# Patient Record
Sex: Male | Born: 1955 | Race: White | Hispanic: No | Marital: Married | Smoking: Former smoker
Health system: Southern US, Community
[De-identification: ages and names within clinical notes are randomized; demographics above are authoritative.]

## PROBLEM LIST (undated history)

## (undated) DIAGNOSIS — Z8719 Personal history of other diseases of the digestive system: Secondary | ICD-10-CM

## (undated) DIAGNOSIS — M1990 Unspecified osteoarthritis, unspecified site: Secondary | ICD-10-CM

## (undated) DIAGNOSIS — I10 Essential (primary) hypertension: Secondary | ICD-10-CM

## (undated) HISTORY — PX: SHOULDER SURGERY: SHX246

## (undated) HISTORY — PX: TONSILLECTOMY: SUR1361

## (undated) HISTORY — PX: OTHER SURGICAL HISTORY: SHX169

---

## 1988-04-08 HISTORY — PX: BACK SURGERY: SHX140

## 1998-11-08 ENCOUNTER — Ambulatory Visit (HOSPITAL_COMMUNITY): Admission: RE | Admit: 1998-11-08 | Discharge: 1998-11-08 | Payer: Self-pay | Admitting: Sports Medicine

## 1998-11-08 ENCOUNTER — Encounter: Payer: Self-pay | Admitting: Sports Medicine

## 2013-05-28 ENCOUNTER — Ambulatory Visit: Payer: Worker's Compensation

## 2013-05-28 ENCOUNTER — Other Ambulatory Visit: Payer: Self-pay | Admitting: Occupational Medicine

## 2013-05-28 DIAGNOSIS — M25512 Pain in left shoulder: Secondary | ICD-10-CM

## 2013-08-04 ENCOUNTER — Encounter (HOSPITAL_COMMUNITY): Payer: Self-pay | Admitting: *Deleted

## 2013-08-04 MED ORDER — CEFAZOLIN SODIUM-DEXTROSE 2-3 GM-% IV SOLR
2.0000 g | INTRAVENOUS | Status: AC
  Administered 2013-08-05: 2 g via INTRAVENOUS
  Filled 2013-08-04: qty 50

## 2013-08-04 MED ORDER — CHLORHEXIDINE GLUCONATE 4 % EX LIQD
60.0000 mL | Freq: Once | CUTANEOUS | Status: DC
  Filled 2013-08-04: qty 60

## 2013-08-04 NOTE — Progress Notes (Signed)
Pt's PCP is Dr. Bobette MoHelen Blackburn in [place], [place].  He states he has not had an EKG or CXR in past year. Denies chest pain or sob.

## 2013-08-04 NOTE — H&P (Signed)
  [name] BordersMichael L [name] is an 58 y.o. male.    Chief Complaint: left shoulder pain and weakness  HPI: Pt is a 58 y.o. male complaining of left shoulder pain for several months. Pain had continually increased since the beginning. X-rays in the clinic show large rotator cuff tear left shoulder. Pt has tried various conservative treatments which have failed to alleviate their symptoms, including injections and therapy. Various options are discussed with the patient. Risks, benefits and expectations were discussed with the patient. Patient understand the risks, benefits and expectations and wishes to proceed with surgery.   PCP:  No primary provider on file.  D/C Plans:  Home  PMH: Past Medical History  Diagnosis Date  . Hypertension   . H/O hiatal hernia   . Arthritis     PSH: Past Surgical History  Procedure Laterality Date  . Back surgery  1990    lumbar fusion  . Shoulder surgery      right  . Arthroscopic knee surgery Left   . Tonsillectomy      Social History:  reports that he quit smoking about 30 years ago. He has never used smokeless tobacco. He reports that he does not drink alcohol or use illicit drugs.  Allergies:  No Known Allergies  Medications: No current facility-administered medications for this encounter.   No current outpatient prescriptions on file.    No results found for this or any previous visit (from the past 48 hour(s)). No results found.  ROS: Pain with rom of the left upper extremity  Physical Exam:  Alert and oriented 58 y.o. male in no acute distress Cranial nerves 2-12 intact Cervical spine: full rom with no tenderness, nv intact distally Chest: active breath sounds bilaterally, no wheeze rhonchi or rales Heart: regular rate and rhythm, no murmur Abd: non tender non distended with active bowel sounds Hip is stable with rom  Left upper extremity with mild limitation with rom nv intact distally No rashes or  edema  Assessment/Plan Assessment: left shoulder large rotator cuff tear  Plan: Patient will undergo a left shoulder scope with rotator cuff repair by [facility]. Risks benefits and expectations were discussed with the patient. Patient understand risks, benefits and expectations and wishes to proceed.

## 2013-08-05 ENCOUNTER — Encounter (HOSPITAL_COMMUNITY): Payer: Self-pay | Admitting: *Deleted

## 2013-08-05 ENCOUNTER — Ambulatory Visit (HOSPITAL_COMMUNITY): Payer: Worker's Compensation | Admitting: Vascular Surgery

## 2013-08-05 ENCOUNTER — Ambulatory Visit (HOSPITAL_COMMUNITY): Payer: Worker's Compensation

## 2013-08-05 ENCOUNTER — Encounter (HOSPITAL_COMMUNITY)
Admission: RE | Disposition: A | Discharge status: Home / Self Care | Payer: Self-pay | Source: Ambulatory Visit | Attending: Orthopedic Surgery

## 2013-08-05 ENCOUNTER — Observation Stay (HOSPITAL_COMMUNITY)
Admission: RE | Admit: 2013-08-05 | Discharge: 2013-08-06 | Disposition: A | Discharge status: Home / Self Care | Payer: Worker's Compensation | Source: Ambulatory Visit | Attending: Orthopedic Surgery | Admitting: Orthopedic Surgery

## 2013-08-05 ENCOUNTER — Encounter (HOSPITAL_COMMUNITY): Payer: Worker's Compensation | Admitting: Vascular Surgery

## 2013-08-05 ENCOUNTER — Encounter (HOSPITAL_COMMUNITY): Payer: Worker's Compensation | Admitting: Pharmacy Technician

## 2013-08-05 DIAGNOSIS — S43429A Sprain of unspecified rotator cuff capsule, initial encounter: Principal | ICD-10-CM | POA: Diagnosis present

## 2013-08-05 DIAGNOSIS — Z5333 Arthroscopic surgical procedure converted to open procedure: Secondary | ICD-10-CM | POA: Insufficient documentation

## 2013-08-05 DIAGNOSIS — I10 Essential (primary) hypertension: Secondary | ICD-10-CM | POA: Insufficient documentation

## 2013-08-05 DIAGNOSIS — Z981 Arthrodesis status: Secondary | ICD-10-CM | POA: Insufficient documentation

## 2013-08-05 DIAGNOSIS — Z87891 Personal history of nicotine dependence: Secondary | ICD-10-CM | POA: Insufficient documentation

## 2013-08-05 DIAGNOSIS — K449 Diaphragmatic hernia without obstruction or gangrene: Secondary | ICD-10-CM | POA: Insufficient documentation

## 2013-08-05 DIAGNOSIS — Y9289 Other specified places as the place of occurrence of the external cause: Secondary | ICD-10-CM | POA: Insufficient documentation

## 2013-08-05 DIAGNOSIS — W19XXXA Unspecified fall, initial encounter: Secondary | ICD-10-CM | POA: Insufficient documentation

## 2013-08-05 DIAGNOSIS — Y990 Civilian activity done for income or pay: Secondary | ICD-10-CM | POA: Insufficient documentation

## 2013-08-05 DIAGNOSIS — S43439A Superior glenoid labrum lesion of unspecified shoulder, initial encounter: Secondary | ICD-10-CM | POA: Insufficient documentation

## 2013-08-05 HISTORY — PX: SHOULDER ARTHROSCOPY WITH SUBACROMIAL DECOMPRESSION AND OPEN ROTATOR C: SHX5688

## 2013-08-05 HISTORY — DX: Personal history of other diseases of the digestive system: Z87.19

## 2013-08-05 HISTORY — DX: Unspecified osteoarthritis, unspecified site: M19.90

## 2013-08-05 HISTORY — DX: Essential (primary) hypertension: I10

## 2013-08-05 LAB — BASIC METABOLIC PANEL
BUN: 21 mg/dL (ref 6–23)
CO2: 24 mEq/L (ref 19–32)
Calcium: 9.8 mg/dL (ref 8.4–10.5)
Chloride: 98 mEq/L (ref 96–112)
Creatinine, Ser: 0.82 mg/dL (ref 0.50–1.35)
Glucose, Bld: 82 mg/dL (ref 70–99)
POTASSIUM: 4 meq/L (ref 3.7–5.3)
Sodium: 137 mEq/L (ref 137–147)

## 2013-08-05 LAB — CBC
HCT: 44.7 % (ref 39.0–52.0)
Hemoglobin: 15.2 g/dL (ref 13.0–17.0)
MCH: 29.3 pg (ref 26.0–34.0)
MCHC: 34 g/dL (ref 30.0–36.0)
MCV: 86.3 fL (ref 78.0–100.0)
Platelets: 208 10*3/uL (ref 150–400)
RBC: 5.18 MIL/uL (ref 4.22–5.81)
RDW: 13.3 % (ref 11.5–15.5)
WBC: 8.1 10*3/uL (ref 4.0–10.5)

## 2013-08-05 SURGERY — SHOULDER ARTHROSCOPY WITH SUBACROMIAL DECOMPRESSION AND OPEN ROTATOR CUFF REPAIR, OPEN BICEPS TENDON REPAIR
Anesthesia: General | Site: Shoulder | Laterality: Left

## 2013-08-05 MED ORDER — OXYCODONE HCL 5 MG PO TABS
5.0000 mg | ORAL_TABLET | ORAL | Status: DC
  Administered 2013-08-05 – 2013-08-06 (×5): 10 mg via ORAL
  Filled 2013-08-05 (×5): qty 2

## 2013-08-05 MED ORDER — PROMETHAZINE HCL 25 MG/ML IJ SOLN
6.2500 mg | INTRAMUSCULAR | Status: AC | PRN
  Administered 2013-08-05 (×2): 6.25 mg via INTRAVENOUS

## 2013-08-05 MED ORDER — ONDANSETRON HCL 4 MG PO TABS: 4.0000 mg | ORAL_TABLET | Freq: Four times a day (QID) | ORAL | Status: DC | PRN

## 2013-08-05 MED ORDER — MIDAZOLAM HCL 2 MG/2ML IJ SOLN
INTRAMUSCULAR | Status: AC
  Filled 2013-08-05: qty 2

## 2013-08-05 MED ORDER — NEOSTIGMINE METHYLSULFATE 10 MG/10ML IV SOLN
INTRAVENOUS | Status: AC
  Filled 2013-08-05: qty 1

## 2013-08-05 MED ORDER — METOCLOPRAMIDE HCL 10 MG PO TABS: 5.0000 mg | ORAL_TABLET | Freq: Three times a day (TID) | ORAL | Status: DC | PRN

## 2013-08-05 MED ORDER — SUCCINYLCHOLINE CHLORIDE 20 MG/ML IJ SOLN
INTRAMUSCULAR | Status: DC | PRN
  Administered 2013-08-05: 100 mg via INTRAVENOUS

## 2013-08-05 MED ORDER — LIDOCAINE HCL (CARDIAC) 20 MG/ML IV SOLN
INTRAVENOUS | Status: AC
  Filled 2013-08-05: qty 5

## 2013-08-05 MED ORDER — METHOCARBAMOL 500 MG PO TABS: 500.0000 mg | ORAL_TABLET | Freq: Four times a day (QID) | ORAL | Status: DC | PRN

## 2013-08-05 MED ORDER — 0.9 % SODIUM CHLORIDE (POUR BTL) OPTIME
TOPICAL | Status: DC | PRN
  Administered 2013-08-05: 1000 mL

## 2013-08-05 MED ORDER — BUPIVACAINE-EPINEPHRINE (PF) 0.25% -1:200000 IJ SOLN
INTRAMUSCULAR | Status: AC
  Filled 2013-08-05: qty 30

## 2013-08-05 MED ORDER — LACTATED RINGERS IV SOLN
INTRAVENOUS | Status: DC
  Administered 2013-08-05 (×4): via INTRAVENOUS

## 2013-08-05 MED ORDER — MIDAZOLAM HCL 5 MG/5ML IJ SOLN
INTRAMUSCULAR | Status: DC | PRN
  Administered 2013-08-05: 2 mg via INTRAVENOUS

## 2013-08-05 MED ORDER — SODIUM CHLORIDE 0.9 % IV SOLN
10.0000 mg | INTRAVENOUS | Status: DC | PRN
  Administered 2013-08-05: 10 ug/min via INTRAVENOUS

## 2013-08-05 MED ORDER — METHOCARBAMOL 1000 MG/10ML IJ SOLN
500.0000 mg | Freq: Four times a day (QID) | INTRAVENOUS | Status: DC
  Administered 2013-08-05: 500 mg via INTRAVENOUS
  Filled 2013-08-05 (×5): qty 5

## 2013-08-05 MED ORDER — BUPIVACAINE-EPINEPHRINE 0.25% -1:200000 IJ SOLN
INTRAMUSCULAR | Status: DC | PRN
  Administered 2013-08-05: 10 mL

## 2013-08-05 MED ORDER — FERROUS SULFATE 325 (65 FE) MG PO TABS
325.0000 mg | ORAL_TABLET | Freq: Every day | ORAL | Status: DC
  Administered 2013-08-06: 325 mg via ORAL
  Filled 2013-08-05 (×2): qty 1

## 2013-08-05 MED ORDER — PROPOFOL 10 MG/ML IV BOLUS
INTRAVENOUS | Status: DC | PRN
  Administered 2013-08-05: 200 mg via INTRAVENOUS

## 2013-08-05 MED ORDER — MELOXICAM 15 MG PO TABS
15.0000 mg | ORAL_TABLET | Freq: Every day | ORAL | Status: DC
  Administered 2013-08-05: 15 mg via ORAL
  Filled 2013-08-05 (×2): qty 1

## 2013-08-05 MED ORDER — HYDROCHLOROTHIAZIDE 25 MG PO TABS
25.0000 mg | ORAL_TABLET | Freq: Every day | ORAL | Status: DC
  Administered 2013-08-05: 25 mg via ORAL
  Filled 2013-08-05 (×2): qty 1

## 2013-08-05 MED ORDER — TRAMADOL HCL 50 MG PO TABS: 50.0000 mg | ORAL_TABLET | Freq: Every day | ORAL | Status: DC | PRN

## 2013-08-05 MED ORDER — OMEGA-3-ACID ETHYL ESTERS 1 G PO CAPS
1.0000 g | ORAL_CAPSULE | Freq: Every day | ORAL | Status: DC
  Administered 2013-08-05: 1 g via ORAL
  Filled 2013-08-05 (×2): qty 1

## 2013-08-05 MED ORDER — ENOXAPARIN SODIUM 40 MG/0.4ML ~~LOC~~ SOLN
40.0000 mg | SUBCUTANEOUS | Status: DC
  Administered 2013-08-06: 40 mg via SUBCUTANEOUS
  Filled 2013-08-05 (×2): qty 0.4

## 2013-08-05 MED ORDER — DEXTROSE 5 % IV SOLN: 500.0000 mg | Freq: Four times a day (QID) | INTRAVENOUS | Status: DC | PRN

## 2013-08-05 MED ORDER — ENOXAPARIN SODIUM 40 MG/0.4ML ~~LOC~~ SOLN: 40.0000 mg | SUBCUTANEOUS | Status: DC

## 2013-08-05 MED ORDER — METOCLOPRAMIDE HCL 5 MG/ML IJ SOLN
5.0000 mg | Freq: Three times a day (TID) | INTRAMUSCULAR | Status: DC | PRN
  Administered 2013-08-05: 10 mg via INTRAVENOUS
  Filled 2013-08-05: qty 2

## 2013-08-05 MED ORDER — PROMETHAZINE HCL 50 MG PO TABS: 25.0000 mg | ORAL_TABLET | Freq: Four times a day (QID) | ORAL | Status: AC | PRN

## 2013-08-05 MED ORDER — METHOCARBAMOL 500 MG PO TABS: 500.0000 mg | ORAL_TABLET | Freq: Three times a day (TID) | ORAL | Status: AC | PRN

## 2013-08-05 MED ORDER — OXYCODONE HCL 5 MG PO TABS: 5.0000 mg | ORAL_TABLET | Freq: Once | ORAL | Status: DC | PRN

## 2013-08-05 MED ORDER — ONDANSETRON HCL 4 MG/2ML IJ SOLN
INTRAMUSCULAR | Status: AC
  Filled 2013-08-05: qty 2

## 2013-08-05 MED ORDER — OXYCODONE HCL 5 MG/5ML PO SOLN
5.0000 mg | Freq: Once | ORAL | Status: DC | PRN
Start: 2013-08-05 — End: 2013-08-05

## 2013-08-05 MED ORDER — PROMETHAZINE HCL 25 MG/ML IJ SOLN
INTRAMUSCULAR | Status: AC
  Filled 2013-08-05: qty 1

## 2013-08-05 MED ORDER — FENTANYL CITRATE 0.05 MG/ML IJ SOLN
INTRAMUSCULAR | Status: AC
  Filled 2013-08-05: qty 2

## 2013-08-05 MED ORDER — KETOROLAC TROMETHAMINE 30 MG/ML IJ SOLN
INTRAMUSCULAR | Status: DC | PRN
  Administered 2013-08-05: 30 mg via INTRAVENOUS

## 2013-08-05 MED ORDER — MENTHOL 3 MG MT LOZG: 1.0000 | LOZENGE | OROMUCOSAL | Status: DC | PRN

## 2013-08-05 MED ORDER — PROPOFOL 10 MG/ML IV BOLUS
INTRAVENOUS | Status: AC
  Filled 2013-08-05: qty 20

## 2013-08-05 MED ORDER — PANTOPRAZOLE SODIUM 40 MG PO TBEC
80.0000 mg | DELAYED_RELEASE_TABLET | Freq: Every day | ORAL | Status: DC
  Administered 2013-08-05: 80 mg via ORAL
  Filled 2013-08-05: qty 2

## 2013-08-05 MED ORDER — CEFAZOLIN SODIUM-DEXTROSE 2-3 GM-% IV SOLR
2.0000 g | Freq: Four times a day (QID) | INTRAVENOUS | Status: DC
  Administered 2013-08-05 – 2013-08-06 (×2): 2 g via INTRAVENOUS
  Filled 2013-08-05 (×3): qty 50

## 2013-08-05 MED ORDER — GLYCOPYRROLATE 0.2 MG/ML IJ SOLN
INTRAMUSCULAR | Status: AC
  Filled 2013-08-05: qty 3

## 2013-08-05 MED ORDER — SODIUM CHLORIDE 0.9 % IR SOLN
Status: DC | PRN
  Administered 2013-08-05: 6000 mL

## 2013-08-05 MED ORDER — ACETAMINOPHEN 650 MG RE SUPP: 650.0000 mg | Freq: Four times a day (QID) | RECTAL | Status: DC | PRN

## 2013-08-05 MED ORDER — METHOCARBAMOL 500 MG PO TABS
500.0000 mg | ORAL_TABLET | Freq: Four times a day (QID) | ORAL | Status: DC
  Administered 2013-08-06: 500 mg via ORAL
  Filled 2013-08-05 (×5): qty 1

## 2013-08-05 MED ORDER — PROMETHAZINE HCL 25 MG/ML IJ SOLN: 25.0000 mg | INTRAMUSCULAR | Status: DC | PRN

## 2013-08-05 MED ORDER — KETOROLAC TROMETHAMINE 30 MG/ML IJ SOLN
INTRAMUSCULAR | Status: AC
  Filled 2013-08-05: qty 1

## 2013-08-05 MED ORDER — BUPIVACAINE-EPINEPHRINE (PF) 0.5% -1:200000 IJ SOLN
INTRAMUSCULAR | Status: DC | PRN
  Administered 2013-08-05: 150 mL

## 2013-08-05 MED ORDER — ROCURONIUM BROMIDE 50 MG/5ML IV SOLN
INTRAVENOUS | Status: AC
  Filled 2013-08-05: qty 1

## 2013-08-05 MED ORDER — ALBUMIN HUMAN 5 % IV SOLN
INTRAVENOUS | Status: DC | PRN
  Administered 2013-08-05: 18:00:00 via INTRAVENOUS

## 2013-08-05 MED ORDER — FENTANYL CITRATE 0.05 MG/ML IJ SOLN
INTRAMUSCULAR | Status: AC
  Filled 2013-08-05: qty 5

## 2013-08-05 MED ORDER — PHENYLEPHRINE 40 MCG/ML (10ML) SYRINGE FOR IV PUSH (FOR BLOOD PRESSURE SUPPORT)
PREFILLED_SYRINGE | INTRAVENOUS | Status: AC
  Filled 2013-08-05: qty 10

## 2013-08-05 MED ORDER — ONDANSETRON HCL 4 MG/2ML IJ SOLN
INTRAMUSCULAR | Status: DC | PRN
  Administered 2013-08-05: 4 mg via INTRAVENOUS

## 2013-08-05 MED ORDER — PHENOL 1.4 % MT LIQD: 1.0000 | OROMUCOSAL | Status: DC | PRN

## 2013-08-05 MED ORDER — OXYCODONE-ACETAMINOPHEN 7.5-325 MG PO TABS: 1.0000 | ORAL_TABLET | ORAL | Status: AC | PRN

## 2013-08-05 MED ORDER — ACETAMINOPHEN 325 MG PO TABS: 650.0000 mg | ORAL_TABLET | Freq: Four times a day (QID) | ORAL | Status: DC | PRN

## 2013-08-05 MED ORDER — OXYCODONE HCL 5 MG PO TABS: 5.0000 mg | ORAL_TABLET | ORAL | Status: DC | PRN

## 2013-08-05 MED ORDER — LISINOPRIL 40 MG PO TABS
40.0000 mg | ORAL_TABLET | Freq: Every day | ORAL | Status: DC
  Administered 2013-08-05: 40 mg via ORAL
  Filled 2013-08-05 (×2): qty 1

## 2013-08-05 MED ORDER — HYDROMORPHONE HCL PF 1 MG/ML IJ SOLN: 0.2500 mg | INTRAMUSCULAR | Status: DC | PRN

## 2013-08-05 MED ORDER — HYDROMORPHONE HCL PF 1 MG/ML IJ SOLN: 0.5000 mg | INTRAMUSCULAR | Status: DC | PRN

## 2013-08-05 MED ORDER — FENTANYL CITRATE 0.05 MG/ML IJ SOLN
INTRAMUSCULAR | Status: DC | PRN
  Administered 2013-08-05 (×5): 50 ug via INTRAVENOUS

## 2013-08-05 MED ORDER — ONDANSETRON HCL 4 MG/2ML IJ SOLN: 4.0000 mg | Freq: Four times a day (QID) | INTRAMUSCULAR | Status: DC | PRN

## 2013-08-05 SURGICAL SUPPLY — 66 items
ANCH SUT 2 1.3X1 LD 1 STRN (Anchor) ×2 IMPLANT
ANCH SUT 360D 2 2 LD 2 STRN (Anchor) ×3 IMPLANT
ANCHOR ALL- SUT RC 2 SUT Y-K (Anchor) ×6 IMPLANT
ANCHOR ALL-SUT FLEX 1.3 Y-KNOT (Anchor) ×4 IMPLANT
ANCHOR ALL-SUT RC 2 SUT Y-K (Anchor) IMPLANT
BIT DRILL 1.3M DISPOSABLE (BIT) ×2 IMPLANT
BLADE 10 SAFETY STRL DISP (BLADE) ×3 IMPLANT
BLADE SURG 11 STRL SS (BLADE) ×3 IMPLANT
BUR OVAL 4.0 (BURR) ×2 IMPLANT
CLOSURE WOUND 1/2 X4 (GAUZE/BANDAGES/DRESSINGS) ×1
COVER SURGICAL LIGHT HANDLE (MISCELLANEOUS) ×3 IMPLANT
DRAPE INCISE IOBAN 66X45 STRL (DRAPES) ×3 IMPLANT
DRAPE STERI 35X30 U-POUCH (DRAPES) ×3 IMPLANT
DRAPE U-SHAPE 47X51 STRL (DRAPES) ×3 IMPLANT
DRSG EMULSION OIL 3X3 NADH (GAUZE/BANDAGES/DRESSINGS) ×6 IMPLANT
DRSG PAD ABDOMINAL 8X10 ST (GAUZE/BANDAGES/DRESSINGS) ×4 IMPLANT
DURAPREP 26ML APPLICATOR (WOUND CARE) ×3 IMPLANT
ELECT NDL TIP 2.8 STRL (NEEDLE) IMPLANT
ELECT NEEDLE TIP 2.8 STRL (NEEDLE) ×3 IMPLANT
ELECT REM PT RETURN 9FT ADLT (ELECTROSURGICAL)
ELECTRODE REM PT RTRN 9FT ADLT (ELECTROSURGICAL) IMPLANT
GLOVE BIOGEL PI ORTHO PRO 7.5 (GLOVE) ×2
GLOVE BIOGEL PI ORTHO PRO SZ8 (GLOVE) ×2
GLOVE ORTHO TXT STRL SZ7.5 (GLOVE) ×3 IMPLANT
GLOVE PI ORTHO PRO STRL 7.5 (GLOVE) ×1 IMPLANT
GLOVE PI ORTHO PRO STRL SZ8 (GLOVE) ×1 IMPLANT
GLOVE SURG ORTHO 8.5 STRL (GLOVE) ×3 IMPLANT
GOWN STRL REUS W/ TWL XL LVL3 (GOWN DISPOSABLE) ×4 IMPLANT
GOWN STRL REUS W/TWL XL LVL3 (GOWN DISPOSABLE) ×12
KIT BASIN OR (CUSTOM PROCEDURE TRAY) ×3 IMPLANT
KIT ROOM TURNOVER OR (KITS) ×3 IMPLANT
MANIFOLD NEPTUNE II (INSTRUMENTS) ×3 IMPLANT
NDL HYPO 25GX1X1/2 BEV (NEEDLE) ×1 IMPLANT
NDL SPNL 18GX3.5 QUINCKE PK (NEEDLE) ×1 IMPLANT
NDL SUT .5 MAYO 1.404X.05X (NEEDLE) ×1 IMPLANT
NDL SUT 6 .5 CRC .975X.05 MAYO (NEEDLE) ×1 IMPLANT
NEEDLE HYPO 25GX1X1/2 BEV (NEEDLE) ×3 IMPLANT
NEEDLE MAYO TAPER (NEEDLE) ×6
NEEDLE SPNL 18GX3.5 QUINCKE PK (NEEDLE) ×3 IMPLANT
NS IRRIG 1000ML POUR BTL (IV SOLUTION) ×3 IMPLANT
PACK SHOULDER (CUSTOM PROCEDURE TRAY) ×3 IMPLANT
PAD ARMBOARD 7.5X6 YLW CONV (MISCELLANEOUS) ×6 IMPLANT
RESECTOR FULL RADIUS 4.2MM (BLADE) ×3 IMPLANT
SET ARTHROSCOPY TUBING (MISCELLANEOUS) ×3
SET ARTHROSCOPY TUBING LN (MISCELLANEOUS) ×1 IMPLANT
SLING ARM LRG ADULT FOAM STRAP (SOFTGOODS) ×1 IMPLANT
SLING ARM MED ADULT FOAM STRAP (SOFTGOODS) IMPLANT
SLING ULTRA II LARGE (SOFTGOODS) ×2 IMPLANT
SPONGE GAUZE 4X4 12PLY (GAUZE/BANDAGES/DRESSINGS) ×3 IMPLANT
STRIP CLOSURE SKIN 1/2X4 (GAUZE/BANDAGES/DRESSINGS) ×2 IMPLANT
SUT FIBERWIRE #2 38 T-5 BLUE (SUTURE)
SUT MNCRL AB 3-0 PS2 18 (SUTURE) ×3 IMPLANT
SUT VIC AB 0 CT2 27 (SUTURE) IMPLANT
SUT VIC AB 2-0 CT1 27 (SUTURE) ×6
SUT VIC AB 2-0 CT1 TAPERPNT 27 (SUTURE) ×1 IMPLANT
SUT VICRYL 0 CT 1 36IN (SUTURE) ×7 IMPLANT
SUTURE FIBERWR #2 38 T-5 BLUE (SUTURE) IMPLANT
SYR CONTROL 10ML LL (SYRINGE) ×3 IMPLANT
TAPE CLOTH SURG 4X10 WHT LF (GAUZE/BANDAGES/DRESSINGS) ×2 IMPLANT
TAPE CLOTH SURG 6X10 WHT LF (GAUZE/BANDAGES/DRESSINGS) ×2 IMPLANT
TOWEL OR 17X24 6PK STRL BLUE (TOWEL DISPOSABLE) ×3 IMPLANT
TOWEL OR 17X26 10 PK STRL BLUE (TOWEL DISPOSABLE) ×3 IMPLANT
TUBE CONNECTING 12'X1/4 (SUCTIONS) ×1
TUBE CONNECTING 12X1/4 (SUCTIONS) ×2 IMPLANT
WAND 90 DEG TURBOVAC W/CORD (SURGICAL WAND) ×3 IMPLANT
WATER STERILE IRR 1000ML POUR (IV SOLUTION) ×3 IMPLANT

## 2013-08-05 NOTE — Progress Notes (Signed)
Orthopedics Progress Note  Subjective: Patient resting comfortably  Objective:  Filed Vitals:   08/05/13 2125  BP: 130/68  Pulse: 88  Temp: 97.4 F (36.3 C)  Resp: 20    General: Awake and alert  Musculoskeletal: left shoulder dressing CDI Neurovascularly intact  Lab Results  Component Value Date   WBC 8.1 08/05/2013   HGB 15.2 08/05/2013   HCT 44.7 08/05/2013   MCV 86.3 08/05/2013   PLT 208 08/05/2013       Component Value Date/Time   NA 137 08/05/2013 1328   K 4.0 08/05/2013 1328   CL 98 08/05/2013 1328   CO2 24 08/05/2013 1328   GLUCOSE 82 08/05/2013 1328   BUN 21 08/05/2013 1328   CREATININE 0.82 08/05/2013 1328   CALCIUM 9.8 08/05/2013 1328   GFRNONAA >90 08/05/2013 1328   GFRAA >90 08/05/2013 1328    No results found for this basename: INR, PROTIME    Assessment/Plan: POD #0 s/p Procedure(s): LEFT SHOULDER ARTHROSCOPY WITH SUBACROMIAL DECOMPRESSION AND MINI OPEN ROTATOR CUFF REPAIR, BICEP TENDODESIS Patient very nauseated and vomiting after surgery.  Will observe overnight, hydrate and manage his pain.  Tomorrow will plan D/C after OT as ordered. Stable now  Commercial Metals CompanySteven R. Ranell PatrickNorris, MD 08/05/2013 9:53 PM

## 2013-08-05 NOTE — Progress Notes (Signed)
Orthopedic Tech Progress Note Patient Details:  [name] BordersMichael L [name] 05/23/1955 [id number]  Ortho Devices Type of Ortho Device: Shoulder abduction pillow Ortho Device/Splint Interventions: Ordered   Jennye Moccasinnthony Craig Jermya Dowding 08/05/2013, 5:04 PM

## 2013-08-05 NOTE — Interval H&P Note (Signed)
History and Physical Interval Note:  08/05/2013 3:55 PM  [name] [name]  has presented today for surgery, with the diagnosis of left shoulder rotator cuff tear/slap  The various methods of treatment have been discussed with the patient and family. After consideration of risks, benefits and other options for treatment, the patient has consented to  Procedure(s): LEFT SHOULDER ARTHROSCOPY WITH SUBACROMIAL DECOMPRESSION AND MINI OPEN ROTATOR CUFF REPAIR, BICEP TENDODESIS (Left) as a surgical intervention .  The patient's history has been reviewed, patient examined, no change in status, stable for surgery.  I have reviewed the patient's chart and labs.  Questions were answered to the patient's satisfaction.     Verlee RossettiSteven R Maison Kestenbaum

## 2013-08-05 NOTE — Discharge Instructions (Signed)
Must keep arm propped to the side or away from the body at all times.  Wear the sling when up walking around or when out of the house.  Ok to remove sling with assistance in the home and then hug a pillow, rest on the arm of a recliner with a pillow under the elbow.   Ice the shoulder constantly!!  Slide the arm along the couch cushions or the arm of the chair in a forward and back direction  Also with the arm propped, rotate the arm gently so the hand moves toward your body then away from the body like a door hinge.  No using the arm for pushing pulling or lifting   Call office for follow up appointment in two weeks  [phone]

## 2013-08-05 NOTE — Progress Notes (Signed)
Orthopedic Tech Progress Note Patient Details:  Susanne BordersMichael L Tritschler 12/22/1955 [id number] Shoulder abduction pillow canceled by Dr. Patient ID: Susanne BordersMichael L Ostermann, male   DOB: 02/25/1956, 58 y.o.   MRN: [id number]   Jennye Moccasinnthony Craig Willella Harding 08/05/2013, 5:09 PM

## 2013-08-05 NOTE — Anesthesia Preprocedure Evaluation (Addendum)
Anesthesia Evaluation  Patient identified by MRN, date of birth, ID band Patient awake    Reviewed: Allergy & Precautions, H&P , NPO status , Patient's Chart, lab work & pertinent test results  History of Anesthesia Complications Negative for: history of anesthetic complications  Airway Mallampati: II TM Distance: >3 FB Neck ROM: Full    Dental  (+) Teeth Intact, Dental Advisory Given, Missing   Pulmonary neg pulmonary ROS, former smoker,    Pulmonary exam normal       Cardiovascular hypertension, negative cardio ROS      Neuro/Psych negative neurological ROS  negative psych ROS   GI/Hepatic Neg liver ROS, hiatal hernia,   Endo/Other  negative endocrine ROSMorbid obesity  Renal/GU negative Renal ROS     Musculoskeletal   Abdominal   Peds  Hematology   Anesthesia Other Findings   Reproductive/Obstetrics                          Anesthesia Physical Anesthesia Plan  ASA: III  Anesthesia Plan: General   Post-op Pain Management:    Induction: Intravenous  Airway Management Planned: Oral ETT  Additional Equipment:   Intra-op Plan:   Post-operative Plan: Extubation in OR  Informed Consent: I have reviewed the patients History and Physical, chart, labs and discussed the procedure including the risks, benefits and alternatives for the proposed anesthesia with the patient or authorized representative who has indicated his/her understanding and acceptance.   Dental advisory given  Plan Discussed with: CRNA, Anesthesiologist and Surgeon  Anesthesia Plan Comments:         Anesthesia Quick Evaluation

## 2013-08-05 NOTE — Brief Op Note (Signed)
08/05/2013  7:32 PM  PATIENT:  Susanne BordersMichael L Berkovich  58 y.o. male  PRE-OPERATIVE DIAGNOSIS:  left shoulder rotator cuff tear/slap lesion  POST-OPERATIVE DIAGNOSIS:  left shoulder rotator cuff / slap lesion  PROCEDURE:  Procedure(s): LEFT SHOULDER ARTHROSCOPY WITH SUBACROMIAL DECOMPRESSION AND MINI OPEN ROTATOR CUFF REPAIR, BICEP TENDODESIS (Left)  SURGEON:  Surgeon(s) and Role:    * Verlee RossettiSteven R Nevah Dalal, MD - Primary  PHYSICIAN ASSISTANT:   ASSISTANTS: Thea Gisthomas B Dixon, PA-C   ANESTHESIA:   regional and general  EBL:     BLOOD ADMINISTERED:none  DRAINS: none   LOCAL MEDICATIONS USED:  MARCAINE     SPECIMEN:  No Specimen  DISPOSITION OF SPECIMEN:  N/A  COUNTS:  YES  TOURNIQUET:  * No tourniquets in log *  DICTATION: .Note written in EPIC and Other Dictation: Dictation Number [id number]  PLAN OF CARE: Discharge to home after PACU  PATIENT DISPOSITION:  PACU - hemodynamically stable.   Delay start of Pharmacological VTE agent (>24hrs) due to surgical blood loss or risk of bleeding: not applicable

## 2013-08-05 NOTE — Progress Notes (Signed)
Cont. N/V despite 2 doses of IV Phenergan. Wife updated, as was Dr Chaney MallingHodierne and Dr Ranell PatrickNorris. Pt to spend the night.

## 2013-08-05 NOTE — Anesthesia Postprocedure Evaluation (Signed)
Anesthesia Post Note  Patient: [name] BordersMichael L [name]  Procedure(s) Performed: Procedure(s) (LRB): LEFT SHOULDER ARTHROSCOPY WITH SUBACROMIAL DECOMPRESSION AND MINI OPEN ROTATOR CUFF REPAIR, BICEP TENDODESIS (Left)  Anesthesia type: General  Patient location: PACU  Post pain: Pain level controlled and Adequate analgesia  Post assessment: Post-op Vital signs reviewed, Patient's Cardiovascular Status Stable, Respiratory Function Stable, Patent Airway and Pain level controlled  Last Vitals:  Filed Vitals:   08/05/13 2030  BP:   Pulse: 88  Temp:   Resp: 19    Post vital signs: Reviewed and stable  Level of consciousness: awake, alert  and oriented  Complications: No apparent anesthesia complications

## 2013-08-05 NOTE — Progress Notes (Signed)
This pt. Has tested at an elevated risk for obstructed sleep apnea during a pre-surgical work-up.A score of 4 or greater is considered an elevated risk.

## 2013-08-05 NOTE — Transfer of Care (Signed)
Immediate Anesthesia Transfer of Care Note  Patient: Susanne BordersMichael L Godette  Procedure(s) Performed: Procedure(s): LEFT SHOULDER ARTHROSCOPY WITH SUBACROMIAL DECOMPRESSION AND MINI OPEN ROTATOR CUFF REPAIR, BICEP TENDODESIS (Left)  Patient Location: PACU  Anesthesia Type:General and GA combined with regional for post-op pain  Level of Consciousness: awake and alert   Airway & Oxygen Therapy: Patient Spontanous Breathing and Patient connected to nasal cannula oxygen  Post-op Assessment: Report given to PACU RN and Post -op Vital signs reviewed and stable  Post vital signs: Reviewed and stable  Complications: No apparent anesthesia complications

## 2013-08-05 NOTE — Anesthesia Procedure Notes (Addendum)
Anesthesia Regional Block:  Interscalene brachial plexus block  Pre-Anesthetic Checklist: ,, timeout performed, Correct Patient, Correct Site, Correct Laterality, Correct Procedure, Correct Position, site marked, Risks and benefits discussed,  Surgical consent,  Pre-op evaluation,  At surgeon's request and post-op pain management  Laterality: Left  Prep: chloraprep       Needles:  Injection technique: Single-shot  Needle Type: Echogenic Stimulator Needle     Needle Length: 5cm 5 cm Needle Gauge: 22 and 22 G    Additional Needles:  Procedures: ultrasound guided (picture in chart) and nerve stimulator Interscalene brachial plexus block  Nerve Stimulator or Paresthesia:  Response: bicep contraction, 0.45 mA,   Additional Responses:   Narrative:  Start time: 08/05/2013 3:20 PM End time: 08/05/2013 3:46 PM Injection made incrementally with aspirations every 5 mL.  Performed by: Personally  Anesthesiologist: J. Adonis Hugueninan Singer, MD  Additional Notes: Functioning IV was confirmed and monitors applied.  A 50mm 22ga echogenic arrow stimulator was used. Sterile prep and drape,hand hygiene and sterile gloves were used.Ultrasound guidance: relevant anatomy identified, needle position confirmed, local anesthetic spread visualized around nerve(s)., vascular puncture avoided.  Image printed for medical record.  Negative aspiration and negative test dose prior to incremental administration of local anesthetic. The patient tolerated the procedure well. Block placement by Maple HudsonMoser, MD. Initial attempt by Krista BlueSinger, MD unsuccessful.   Procedure Name: Intubation Date/Time: 08/05/2013 4:12 PM Performed by: Sharlene DoryWALKER, Zaryia Markel E Pre-anesthesia Checklist: Patient identified, Emergency Drugs available, Suction available, Patient being monitored and Timeout performed Patient Re-evaluated:Patient Re-evaluated prior to inductionOxygen Delivery Method: Circle system utilized Preoxygenation: Pre-oxygenation with 100%  oxygen Intubation Type: Rapid sequence and Cricoid Pressure applied Laryngoscope Size: Mac and 4 Grade View: Grade I Tube type: Oral Tube size: 7.5 mm Number of attempts: 1 Airway Equipment and Method: Stylet Placement Confirmation: ETT inserted through vocal cords under direct vision,  positive ETCO2 and breath sounds checked- equal and bilateral Secured at: 22 cm Tube secured with: Tape Dental Injury: Teeth and Oropharynx as per pre-operative assessment

## 2013-08-06 ENCOUNTER — Encounter (HOSPITAL_COMMUNITY): Payer: Self-pay | Admitting: Orthopedic Surgery

## 2013-08-06 NOTE — Progress Notes (Signed)
   Subjective: 1 Day Post-Op Procedure(s) (LRB): LEFT SHOULDER ARTHROSCOPY WITH SUBACROMIAL DECOMPRESSION AND MINI OPEN ROTATOR CUFF REPAIR, BICEP TENDODESIS (Left)  Pt feeling better today Ready for d/c home Patient reports pain as mild.  Objective:   VITALS:   Filed Vitals:   08/06/13 0544  BP: 85/57  Pulse: 83  Temp: 98.3 F (36.8 C)  Resp: 20    Left shoulder dressing intact nv intact distally Sling in place No signs of drainage or edema  LABS  Recent Labs  08/05/13 1328  HGB 15.2  HCT 44.7  WBC 8.1  PLT 208     Recent Labs  08/05/13 1328  NA 137  K 4.0  BUN 21  CREATININE 0.82  GLUCOSE 82     Assessment/Plan: 1 Day Post-Op Procedure(s) (LRB): LEFT SHOULDER ARTHROSCOPY WITH SUBACROMIAL DECOMPRESSION AND MINI OPEN ROTATOR CUFF REPAIR, BICEP TENDODESIS (Left)  D/c home today F/u in 2 weeks    [name] [name], MPAS, PA-C  08/06/2013, 7:29 AM

## 2013-08-06 NOTE — Discharge Summary (Signed)
Physician Discharge Summary   Patient ID: Susanne BordersMichael L Carp MRN: [id number] DOB/AGE: 60/07/1955 60 y.o.  Admit date: 08/05/2013 Discharge date: 08/06/2013  Admission Diagnoses:  Active Problems:   Rotator cuff (capsule) sprain   Discharge Diagnoses:  Same   Surgeries: Procedure(s): LEFT SHOULDER ARTHROSCOPY WITH SUBACROMIAL DECOMPRESSION AND MINI OPEN ROTATOR CUFF REPAIR, BICEP TENDODESIS on 08/05/2013   Consultants: none  Discharged Condition: Stable  Hospital Course: Susanne BordersMichael L Clingan is an 58 y.o. male who was admitted 08/05/2013 with a chief complaint of No chief complaint on file. , and found to have a diagnosis of <principal problem not specified>.  They were brought to the operating room on 08/05/2013 and underwent the above named procedures.    The patient had an uncomplicated hospital course and was stable for discharge.  Recent vital signs:  Filed Vitals:   08/06/13 0544  BP: 85/57  Pulse: 83  Temp: 98.3 F (36.8 C)  Resp: 20    Recent laboratory studies:  Results for orders placed during the hospital encounter of 08/05/13  CBC      Result Value Ref Range   WBC 8.1  4.0 - 10.5 K/uL   RBC 5.18  4.22 - 5.81 MIL/uL   Hemoglobin 15.2  13.0 - 17.0 g/dL   HCT 78.244.7  95.639.0 - 21.352.0 %   MCV 86.3  78.0 - 100.0 fL   MCH 29.3  26.0 - 34.0 pg   MCHC 34.0  30.0 - 36.0 g/dL   RDW 08.613.3  57.811.5 - 46.915.5 %   Platelets 208  150 - 400 K/uL  BASIC METABOLIC PANEL      Result Value Ref Range   Sodium 137  137 - 147 mEq/L   Potassium 4.0  3.7 - 5.3 mEq/L   Chloride 98  96 - 112 mEq/L   CO2 24  19 - 32 mEq/L   Glucose, Bld 82  70 - 99 mg/dL   BUN 21  6 - 23 mg/dL   Creatinine, Ser 6.290.82  0.50 - 1.35 mg/dL   Calcium 9.8  8.4 - 52.810.5 mg/dL   GFR calc non Af Amer >90  >90 mL/min   GFR calc Af Amer >90  >90 mL/min    Discharge Medications:     Medication List         Fish Oil 1200 MG Caps  Take 1,200 mg by mouth 2 (two) times daily.     hydrochlorothiazide 25 MG tablet    Commonly known as:  HYDRODIURIL  Take 25 mg by mouth daily.     IRON SUPPLEMENT 325 (65 FE) MG tablet  Generic drug:  ferrous sulfate  Take 325 mg by mouth daily with breakfast.     lisinopril 40 MG tablet  Commonly known as:  PRINIVIL,ZESTRIL  Take 40 mg by mouth daily.     meloxicam 15 MG tablet  Commonly known as:  MOBIC  Take 15 mg by mouth daily.     methocarbamol 500 MG tablet  Commonly known as:  ROBAXIN  Take 1 tablet (500 mg total) by mouth 3 (three) times daily as needed for muscle spasms.     omeprazole 20 MG capsule  Commonly known as:  PRILOSEC  Take 20 mg by mouth daily.     oxyCODONE-acetaminophen 7.5-325 MG per tablet  Commonly known as:  PERCOCET  Take 1 tablet by mouth every 4 (four) hours as needed for pain.     promethazine 50 MG tablet  Commonly known as:  PHENERGAN  Take 0.5 tablets (25 mg total) by mouth every 6 (six) hours as needed for nausea or vomiting.     traMADol 50 MG tablet  Commonly known as:  ULTRAM  Take 50 mg by mouth daily as needed (pain).        Diagnostic Studies: Dg Chest 2 View  08/05/2013   CLINICAL DATA:  htn, pre-op  EXAM: CHEST  2 VIEW  COMPARISON:  No comparisons  FINDINGS: Low lung volumes. Cardiac silhouette is within the upper limits normal. Both lungs are clear. The visualized skeletal structures are unremarkable.  IMPRESSION: No active cardiopulmonary disease.   Electronically Signed   By: Salome HolmesHector  Cooper M.D.   On: 08/05/2013 13:58    Disposition: Final discharge disposition not confirmed        Follow-up Information   Follow up with NORRIS,STEVEN R, MD. Call in 2 weeks. [phone])    Specialty:  Orthopedic Surgery   Contact information:   [address] [place] [address] [phone]        Signed: Thea Gisthomas B Charne Mcbrien 08/06/2013, 7:30 AM

## 2013-08-06 NOTE — Op Note (Signed)
NAMDarrall Dears:  [name], [name]            ACCOUNT NO.:  [id number]  MEDICAL RECORD NO.:  [id number]  LOCATION:  5N02C                        FACILITY:  [facility]  PHYSICIAN:  Almedia BallsSteven R. Ranell PatrickNorris, M.D. DATE OF BIRTH:  1955/09/28  DATE OF PROCEDURE:  08/05/2013 DATE OF DISCHARGE:                              OPERATIVE REPORT   PREOPERATIVE DIAGNOSES:  Left shoulder massive rotator cuff tear with retraction and superior labrum anterior and posterior lesion.  POSTOPERATIVE DIAGNOSES:  Left shoulder massive rotator cuff tear with retraction and superior labrum anterior and posterior lesion.  PROCEDURE PERFORMED:  Left shoulder arthroscopy with extensive intra- articular debridement of torn superior labrum, anterior-posterior arthroscopic biceps tenotomy, arthroscopic subacromial decompression with preservation of medial portion of CA ligament, followed by mini- open rotator cuff repair and biceps tenodesis in the groove.  ATTENDING SURGEON:  Almedia BallsSteven R. Ranell PatrickNorris, M.D.  ASSISTANT:  Donnie Coffinhomas B. Dixon, P.A., who was scrubbed the entire procedure and necessary for satisfactory completion of surgery.  ANESTHESIA:  General anesthesia was used plus interscalene block.  ESTIMATED BLOOD LOSS:  Minimal.  FLUID REPLACEMENT:  1200 mL crystalloids.  INSTRUMENT COUNTS:  Correct.  COMPLICATIONS:  There were no complications.  ANTIBIOTICS:  Perioperative antibiotics were given.  INDICATIONS:  The patient is a 58 year old male who suffered a fall at work, injuring his left shoulder.  The patient presented to urgent care with a painful shoulder that he cannot elevate.  He was eventually referred to Orthopedics and was seen several months after his initial injury and upon further examination clinically and with MRI evaluation, the patient is noted to have a massive multiple tendon and retracted rotator cuff tear as well as a SLAP lesion.  Having failed conservative management, desiring operative treatment to  restore function, eliminate pain, informed consent was obtained.  DESCRIPTION OF PROCEDURE:  After adequate level of anesthesia was achieved, the patient was positioned in a modified beach-chair position. Left shoulder correctly identified and examined under anesthesia.  The patient had full passive range of motion, but no induced stiffness, no instability.  After sterile prep and drape of the shoulder and arm, we called a time-out.  We then entered the shoulder in standard portals including anterior, posterior, lateral portals.  We identified the significant tearing in superior labral biceps junction.  This was quite unstable.  We performed a biceps tenotomy and labral debridement, back to stable labral rim using basket forceps, motorized shaver, articular cartilage.  Glenohumeral joint looked great.  Subscap intact.  Rotator cuff completely torn and retracted at the level of glenoid.  There was no evidence of any intact rotator cuff at all.  At this point, we placed the scope in the subacromial space, performed a thorough bursectomy and acromioplasty, creating a type 1 acromial shape with release of the lateral portion of the CA ligament, but not the medial.  We had nice decompression of rotator cuff out and again the tendon was retracted back to the level of glenoid and we did some gentle arthroscopic immobilization with cuff grasper and a Freer elevator to free up the tendon and did feel like it could move.  Thus, we decided to go and proceed with repair.  We may created a  knee open and we concluded the arthroscopic portion of procedure.  We then entered the shoulder using a mini-open incision starting at the anterolateral border of the acromion staying distally about 4 cm.  This was done in the raphe between the anterior and lateral heads of the deltoid.  Dissection down to subcutaneous tissues.  Needle-tip Bovie was used to split the deltoid and the raphe, placed Arthrex retractor,  identified the biceps tendon. We delivered the tendon out of the soft tissue of the groove.  The bicipital groove and then prepared for the floor of the biceps groove with a needle-tip Bovie and Therapist, nutritionalreer elevator, placed a single Y-Knot Flex anchor through the floor of the biceps groove and brought that suture up in a whipstitch fashion through the biceps tendon and mid tension with the elbow at 90 degrees.  A nice solid low profile tenodesis.  We then oversewed the soft tissue over the top of that biceps with 0 Vicryl suture.  Next, we addressed the rotator cuff tear.  This is a very large massive tear involving 3 tendons.  We went ahead and started the most posterior aspect of the tear and used #2 Hi-Fi suture in the free edge of the tendon, cuff grasper and then a Therapist, nutritionalreer elevator and a Cobb elevator to mobilize the tendon.  We felt like we were able to get it mobilized both off the glenoid and the scapula, and then also off the underside of the acromion.  We had the tendon that was able to be mobilized back into an anatomic position.  We did a combination of side- to-side sutures posteriorly and the remnant of the tendon that was still on the greater tuberosity, and then we had a total of three Y-Knot RC anchors that were double loaded with #2 Hi-Fi suture along the medial portion of the footprint of the rotator cuff, and was brought up through the appropriate portion of tendon in a mattress fashion.  Then, we had to free edge sutures that we took through drill holes out laterally.  We then oversewed the rotator interval.  We had an anatomic repair that was believe or not, actually not under great deal of tension of the patient's arm at his side and under no tension of the arm minimally abducting.  We ranged the shoulder.  No impingement.  No tethering.  We thoroughly irrigated the subdeltoid and subacromial interval, and then repaired deltoid anatomically with 0 Vicryl suture followed by  2-0 Vicryl subcutaneous closure and 4-0 Monocryl for skin and portals. Steri-Strips applied followed by sterile dressing.  The patient tolerated the surgery well.     Almedia BallsSteven R. Ranell PatrickNorris, M.D.     SRN/MEDQ  D:  08/05/2013  T:  08/06/2013  Job:  [id number]

## 2013-08-06 NOTE — Progress Notes (Signed)
UR Completed.  [name] [name] [name] [name] [phone] 08/06/2013  

## 2013-08-06 NOTE — Evaluation (Signed)
Occupational Therapy Evaluation and Discharge Patient Details Name: [name] BordersMichael L [name] MRN: [id number] DOB: 05/18/1955 Today's Date: 08/06/2013    History of Present Illness LEFT SHOULDER ARTHROSCOPY WITH SUBACROMIAL DECOMPRESSION AND MINI OPEN ROTATOR CUFF REPAIR, BICEP TENDODESIS (Left)   Clinical Impression   This 58 yo male Independent pta presents to acute OT with all education completed with pt and wife as well as handouts provided. Acute OT will sign off. Wife is going to put a chair in shower for pt to sit while he is showering.    Follow Up Recommendations  No OT follow up    Equipment Recommendations  None recommended by OT       Precautions / Restrictions Precautions Precautions: Shoulder Shoulder Interventions: Roe Coombson joy ultra sling;Shoulder abduction pillow;At all times (except per wife MD told her that pt could have it off  when sitting in chair and just have a pillow between his arm and his trunk) Required Braces or Orthoses: Sling Restrictions Weight Bearing Restrictions: Yes LUE Weight Bearing: Non weight bearing      Mobility Bed Mobility Overal bed mobility: Needs Assistance Bed Mobility: Supine to Sit;Sit to Supine     Supine to sit: Min assist Sit to supine: Min assist      Transfers Overall transfer level: Needs assistance   Transfers: Sit to/from Stand Sit to Stand: Min guard                   ADL Overall ADL's : Needs assistance/impaired Eating/Feeding: Set up;Sitting   Grooming: Moderate assistance;Sitting;Standing   Upper Body Bathing: Total assistance;Standing   Lower Body Bathing: Total assistance;Sit to/from stand   Upper Body Dressing : Total assistance;Sitting   Lower Body Dressing: Total assistance;Sit to/from stand   Toilet Transfer: Min guard;Comfort height toilet;Grab bars   Toileting- ArchitectClothing Manipulation and Hygiene: Moderate assistance;Sit to/from stand       Functional mobility during ADLs: Min guard General  ADL Comments: Wife aware of how to A pt with U/LB B/D and how to don/doff sling               Pertinent Vitals/Pain No c/o due to block had not worn of as of yet     Hand Dominance Right   Extremity/Trunk Assessment Upper Extremity Assessment Upper Extremity Assessment: LUE deficits/detail LUE Deficits / Details: Here for shoulder sx this admission, rest of LUE WNL with AAROM at elbow due to block had not totally worn off. LUE Coordination: decreased gross motor           Communication Communication Communication: No difficulties   Cognition Arousal/Alertness: Awake/alert Behavior During Therapy: WFL for tasks assessed/performed Overall Cognitive Status: Within Functional Limits for tasks assessed                        Exercises   Other Exercises Other Exercises: Supine: AAROM shoulder exercises in shoulder abduction sling for shoulder abduction>adduction back to shoulder pillow of abduction sling--pt got to about 70 degrees (made him and wife aware no more than 90 degrees for now), shoulder forward flexion in abduction sling--pt again to about 70 degrees (no more than 90 degrees for now), and elbow flexion/extension in sling--10 reps of each (I did 5 and then pt's wife did the other 5). Handout given with pt do them at least 3x today 10 reps each and starting tomorrow 5x/day 10 reps each   Shoulder Instructions Shoulder Instructions Donning/doffing shirt without moving shoulder: Caregiver independent with  task Method for sponge bathing under operated UE: Caregiver independent with task Donning/doffing sling/immobilizer: Caregiver independent with task Correct positioning of sling/immobilizer: Caregiver independent with task Pendulum exercises (written home exercise program):  (NA) ROM for elbow, wrist and digits of operated UE: Caregiver independent with task Sling wearing schedule (on at all times/off for ADL's): Caregiver independent with task Proper  positioning of operated UE when showering: Caregiver independent with task Dressing change:  (NA) Positioning of UE while sleeping: Caregiver independent with task    Home Living Family/patient expects to be discharged to:: Private residence Living Arrangements: Spouse/significant other Available Help at Discharge: Family;Available 24 hours/day Type of Home: House             Bathroom Shower/Tub: Walk-in Human resources officershower   Bathroom Toilet: Standard     Home Equipment: Hand held shower head          Prior Functioning/Environment Level of Independence: Independent                      OT Goals(Current goals can be found in the care plan section) Acute Rehab OT Goals Patient Stated Goal: home today  OT Frequency:                End of Session Equipment Utilized During Treatment:  (Abduction sling)  Activity Tolerance: Patient tolerated treatment well Patient left: in chair;with call bell/phone within reach;with family/visitor present   Time: 0817-0908 OT Time Calculation (min): 51 min Charges:  OT General Charges $OT Visit: 1 Procedure OT Evaluation $Initial OT Evaluation Tier I: 1 Procedure OT Treatments $Self Care/Home Management : 8-22 mins $Therapeutic Exercise: 23-37 mins G-Codes: OT G-codes **NOT FOR INPATIENT CLASS** Functional Assessment Tool Used: Clinical observation Functional Limitation: Self care Self Care Current Status ([id number]): At least 80 percent but less than 100 percent impaired, limited or restricted Self Care Goal Status ([id number](G8988): At least 80 percent but less than 100 percent impaired, limited or restricted Self Care Discharge Status [id number]): At least 80 percent but less than 100 percent impaired, limited or restricted  [name] GeorgesCatherine Eva Rayon [name] [phone] 08/06/2013, 12:08 PM

## 2013-08-09 LAB — GLUCOSE, CAPILLARY: GLUCOSE-CAPILLARY: 132 mg/dL — AB (ref 70–99)

## 2014-11-20 IMAGING — CR DG CHEST 2V
2 series · 2 of 2 positions shown · non-contrast
Comparison: No comparisons

CLINICAL DATA: htn, pre-op

EXAM:
CHEST  2 VIEW

[w chest pa]
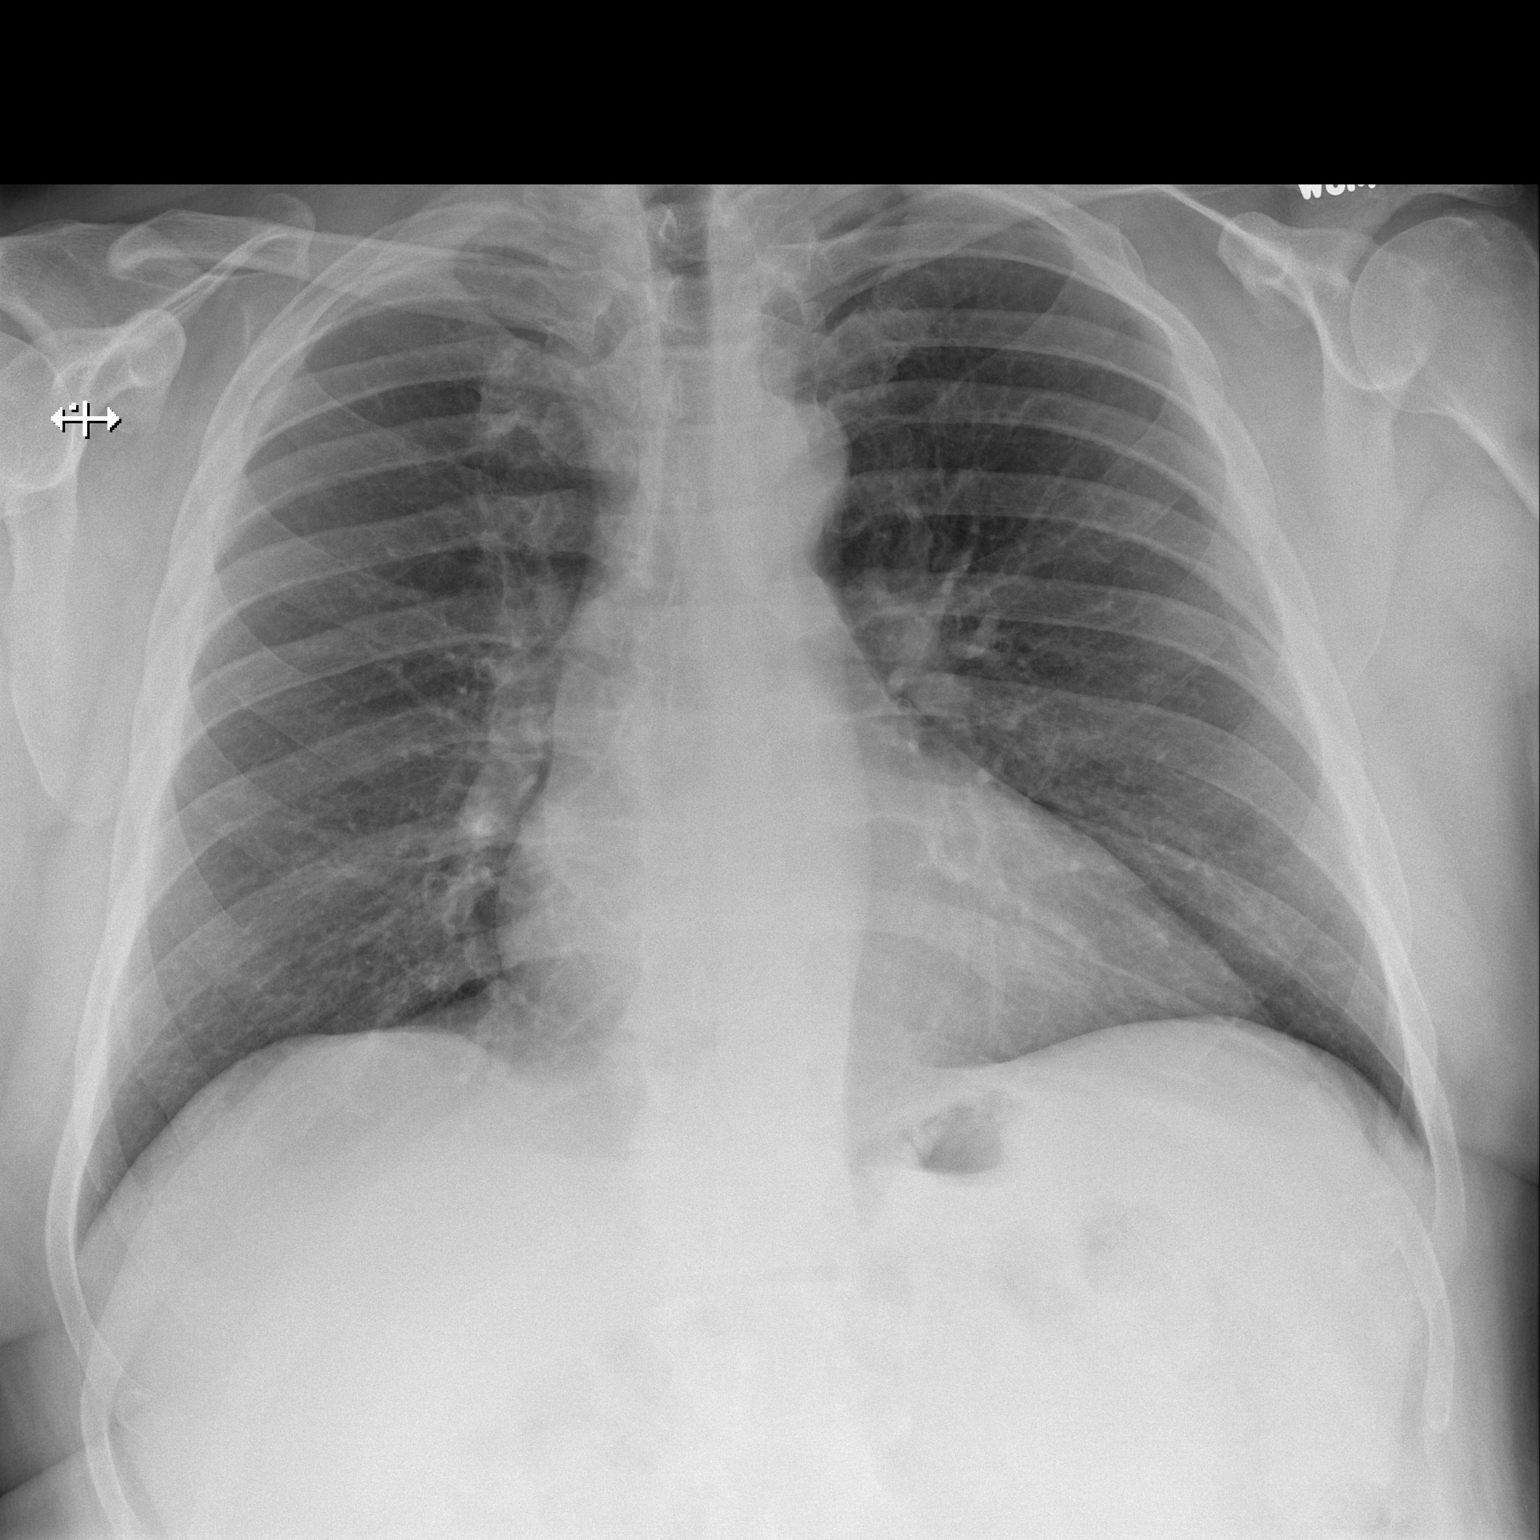

[w chest lat]
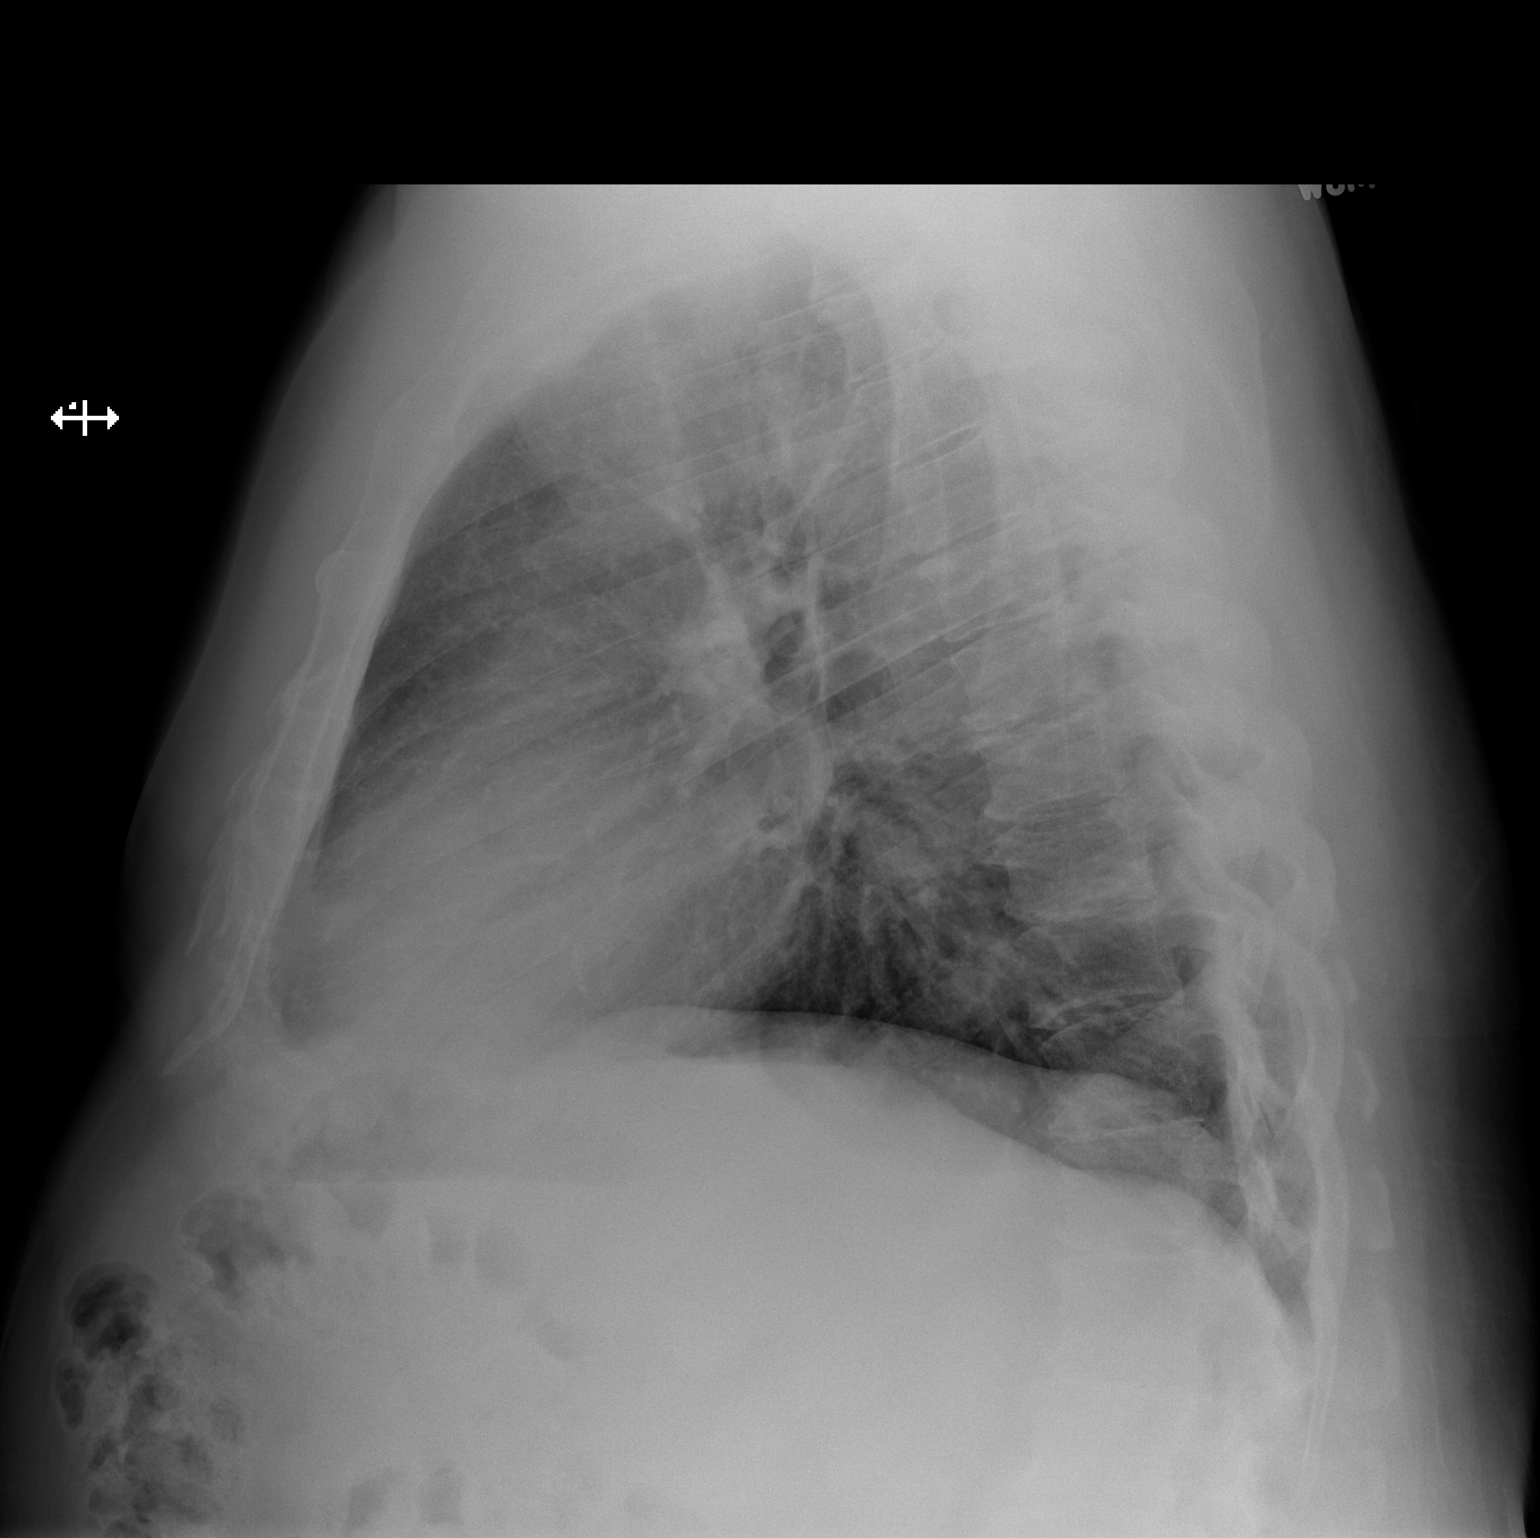

[2 of 2 positions shown; findings below may reference images not displayed]

FINDINGS: Low lung volumes. Cardiac silhouette is within the upper limits
normal. Both lungs are clear. The visualized skeletal structures are
unremarkable.
IMPRESSION: No active cardiopulmonary disease.

## 2017-11-01 DIAGNOSIS — R109 Unspecified abdominal pain: Secondary | ICD-10-CM
